# Patient Record
Sex: Male | Born: 1980 | Race: White | Hispanic: No | Marital: Married | Smoking: Current every day smoker
Health system: Southern US, Community
[De-identification: ages and names within clinical notes are randomized; demographics above are authoritative.]

## PROBLEM LIST (undated history)

## (undated) DIAGNOSIS — K900 Celiac disease: Secondary | ICD-10-CM

## (undated) DIAGNOSIS — M549 Dorsalgia, unspecified: Secondary | ICD-10-CM

## (undated) DIAGNOSIS — K219 Gastro-esophageal reflux disease without esophagitis: Secondary | ICD-10-CM

## (undated) DIAGNOSIS — G8929 Other chronic pain: Secondary | ICD-10-CM

## (undated) DIAGNOSIS — K589 Irritable bowel syndrome without diarrhea: Secondary | ICD-10-CM

## (undated) DIAGNOSIS — I10 Essential (primary) hypertension: Secondary | ICD-10-CM

## (undated) HISTORY — PX: APPENDECTOMY: SHX54

---

## 2002-03-12 ENCOUNTER — Emergency Department (HOSPITAL_COMMUNITY): Admission: EM | Admit: 2002-03-12 | Discharge: 2002-03-12 | Payer: Self-pay | Admitting: Emergency Medicine

## 2002-03-12 ENCOUNTER — Encounter: Payer: Self-pay | Admitting: Emergency Medicine

## 2002-03-13 ENCOUNTER — Emergency Department (HOSPITAL_COMMUNITY): Admission: EM | Admit: 2002-03-13 | Discharge: 2002-03-13 | Payer: Self-pay | Admitting: Emergency Medicine

## 2011-08-14 ENCOUNTER — Encounter (HOSPITAL_COMMUNITY): Payer: Self-pay | Admitting: Emergency Medicine

## 2011-08-14 ENCOUNTER — Emergency Department (HOSPITAL_COMMUNITY)
Admission: EM | Admit: 2011-08-14 | Discharge: 2011-08-14 | Disposition: A | Discharge status: Home / Self Care | Payer: BC Managed Care – PPO | Attending: Emergency Medicine | Admitting: Emergency Medicine

## 2011-08-14 DIAGNOSIS — I10 Essential (primary) hypertension: Secondary | ICD-10-CM | POA: Insufficient documentation

## 2011-08-14 DIAGNOSIS — K219 Gastro-esophageal reflux disease without esophagitis: Secondary | ICD-10-CM | POA: Insufficient documentation

## 2011-08-14 DIAGNOSIS — T7840XA Allergy, unspecified, initial encounter: Secondary | ICD-10-CM | POA: Insufficient documentation

## 2011-08-14 DIAGNOSIS — R1031 Right lower quadrant pain: Secondary | ICD-10-CM | POA: Insufficient documentation

## 2011-08-14 DIAGNOSIS — F17200 Nicotine dependence, unspecified, uncomplicated: Secondary | ICD-10-CM | POA: Insufficient documentation

## 2011-08-14 DIAGNOSIS — R21 Rash and other nonspecific skin eruption: Secondary | ICD-10-CM | POA: Insufficient documentation

## 2011-08-14 HISTORY — DX: Essential (primary) hypertension: I10

## 2011-08-14 HISTORY — DX: Gastro-esophageal reflux disease without esophagitis: K21.9

## 2011-08-14 HISTORY — DX: Other chronic pain: G89.29

## 2011-08-14 HISTORY — DX: Dorsalgia, unspecified: M54.9

## 2011-08-14 LAB — CBC
HCT: 47.1 % (ref 39.0–52.0)
Platelets: 257 10*3/uL (ref 150–400)
RBC: 5.54 MIL/uL (ref 4.22–5.81)
RDW: 13.5 % (ref 11.5–15.5)
WBC: 9.5 10*3/uL (ref 4.0–10.5)

## 2011-08-14 LAB — COMPREHENSIVE METABOLIC PANEL
ALT: 56 U/L — ABNORMAL HIGH (ref 0–53)
AST: 27 U/L (ref 0–37)
Alkaline Phosphatase: 101 U/L (ref 39–117)
CO2: 24 mEq/L (ref 19–32)
Chloride: 100 mEq/L (ref 96–112)
GFR calc non Af Amer: >90 mL/min (ref >90)
Sodium: 138 mEq/L (ref 135–145)
Total Bilirubin: 0.9 mg/dL (ref 0.3–1.2)

## 2011-08-14 LAB — URINALYSIS, ROUTINE W REFLEX MICROSCOPIC
Bilirubin Urine: NEGATIVE
Glucose, UA: NEGATIVE mg/dL
Hgb urine dipstick: NEGATIVE
Ketones, ur: NEGATIVE mg/dL
Leukocytes, UA: NEGATIVE
Nitrite: NEGATIVE
Protein, ur: NEGATIVE mg/dL
Specific Gravity, Urine: 1.012 (ref 1.005–1.030)
Urobilinogen, UA: 0.2 mg/dL (ref 0.0–1.0)
pH: 6.5 (ref 5.0–8.0)

## 2011-08-14 LAB — DIFFERENTIAL
Basophils Absolute: 0 10*3/uL (ref 0.0–0.1)
Lymphocytes Relative: 30 % (ref 12–46)
Lymphs Abs: 2.8 10*3/uL (ref 0.7–4.0)
Neutro Abs: 5.1 10*3/uL (ref 1.7–7.7)

## 2011-08-14 MED ORDER — PREDNISONE 10 MG PO TABS: 40.0000 mg | ORAL_TABLET | Freq: Every day | ORAL | Status: DC

## 2011-08-14 MED ORDER — CETIRIZINE-PSEUDOEPHEDRINE ER 5-120 MG PO TB12: 1.0000 | ORAL_TABLET | Freq: Every day | ORAL | Status: DC

## 2011-08-14 MED ORDER — FENTANYL CITRATE 0.05 MG/ML IJ SOLN: 50.0000 ug | Freq: Once | INTRAMUSCULAR | Status: DC

## 2011-08-14 NOTE — Discharge Instructions (Signed)
Allergic Reaction, Mild to Moderate °Allergies may happen from anything your body is sensitive to. This may be food, medications, pollens, chemicals, and nearly anything around you in everyday life that produces allergens. An allergen is anything that causes an allergy producing substance. Allergens cause your body to release allergic antibodies. Through a chain of events, they cause a release of histamine into the blood stream. Histamines are meant to protect you, but they also cause your discomfort. This is why antihistamines are often used for allergies. Heredity is often a factor in causing allergic reactions. This means you may have some of the same allergies as your parents. °Allergies happen in all age groups. You may have some idea of what caused your reaction. There are many allergens around us. It may be difficult to know what caused your reaction. If this is a first time event, it may never happen again. Allergies cannot be cured but can be controlled with medications. °SYMPTOMS  °You may get some or all of the following problems from allergies. °· Swelling and itching in and around the mouth.  °· Tearing, itchy eyes.  °· Nasal congestion and runny nose.  °· Sneezing and coughing.  °· An itchy red rash or hives.  °· Vomiting or diarrhea.  °· Difficulty breathing.  °Seasonal allergies occur in all age groups. They are seasonal because they usually occur during the same season every year. They may be a reaction to molds, grass pollens, or tree pollens. Other causes of allergies are house dust mite allergens, pet dander and mold spores. These are just a common few of the thousands of allergens around us. All of the symptoms listed above happen when you come in contact with pollens and other allergens. Seasonal allergies are usually not life threatening. They are generally more of a nuisance that can often be handled using medications. °Hay fever is a combination of all or some of the above listed allergy  problems. It may often be treated with simple over-the-counter medications such as diphenhydramine. Take medication as directed. Check with your caregiver or package insert for child dosages. °TREATMENT AND HOME CARE INSTRUCTIONS °If hives or rash are present: °· Take medications as directed.  °· You may use an over-the-counter antihistamine (diphenhydramine) for hives and itching as needed. Do not drive or drink alcohol until medications used to treat the reaction have worn off. Antihistamines tend to make people sleepy.  °· Apply cold cloths (compresses) to the skin or take baths in cool water. This will help itching. Avoid hot baths or showers. Heat will make a rash and itching worse.  °· If your allergies persist and become more severe, and over the counter medications are not effective, there are many new medications your caretaker can prescribe. Immunotherapy or desensitizing injections can be used if all else fails. Follow up with your caregiver if problems continue.  °SEEK MEDICAL CARE IF:  °· Your allergies are becoming progressively more troublesome.  °· You suspect a food allergy. Symptoms generally happen within 30 minutes of eating a food.  °· Your symptoms have not gone away within 2 days or are getting worse.  °· You develop new symptoms.  °· You want to retest yourself or your child with a food or drink you think causes an allergic reaction. Never test yourself or your child of a suspected allergy without being under the watchful eye of your caregivers. A second exposure to an allergen may be life-threatening.  °SEEK IMMEDIATE MEDICAL CARE IF: °· You   develop difficulty breathing or wheezing, or have a tight feeling in your chest or throat.  °· You develop a swollen mouth, hives, swelling, or itching all over your body.  °A severe reaction with any of the above problems should be considered life-threatening. If you suddenly develop difficulty breathing call for local emergency medical help. THIS IS AN  EMERGENCY. °MAKE SURE YOU:  °· Understand these instructions.  °· Will watch your condition.  °· Will get help right away if you are not doing well or get worse.  °Document Released: 02/02/2007 Document Revised: 03/27/2011 Document Reviewed: 02/02/2007 °ExitCare® Patient Information ©2012 [employer]. ° ° ° ° °Epinephrine Injection °Epinephrine is a medicine given by injection to temporarily treat an emergency allergic reaction. It is also used to treat severe asthmatic attacks and other lung problems. The medicine helps to enlarge (dilate) the small breathing tubes of the lungs. A life-threatening, sudden allergic reaction that involves the whole body is called anaphylaxis. Because of potential side effects, epinephrine should only be used as directed by your caregiver. °RISKS AND COMPLICATIONS °Possible side effects of epinephrine injections include: °· Chest pain.  °· Irregular or rapid heartbeat.  °· Shortness of breath.  °· Nausea.  °· Vomiting.  °· Abdominal pain or cramping.  °· Sweating.  °· Dizziness.  °· Weakness.  °· Headache.  °· Nervousness.  °Report all side effects to your caregiver. °HOW TO GIVE AN EPINEPHRINE INJECTION °Give the epinephrine injection immediately when symptoms of a severe reaction begin. Inject the medicine into the outer thigh or any available, large muscle. Your caregiver can teach you how to do this. You do not need to remove any clothing. After the injection, call your local emergency services (911 in [place] Even if you improve after the injection, you need to be examined at a hospital emergency department. Epinephrine works quickly, but it also wears off quickly. Delayed reactions can occur. A delayed reaction may be as serious and dangerous as the initial reaction. °HOME CARE INSTRUCTIONS °· Make sure you and your family know how to give an epinephrine injection.  °· Use epinephrine injections as directed by your caregiver. Do not use this medicine more often or in larger  doses than prescribed.  °· Always carry your epinephrine injection or anaphylaxis kit with you. This can be lifesaving if you have a severe reaction.  °· Store the medicine in a cool, dry place. If the medicine becomes discolored or cloudy, dispose of it properly and replace it with new medicine.  °· Check the expiration date on your medicine. It may be unsafe to use medicines past their expiration date.  °· Tell your caregiver about any other medicines you are taking. Some medicines can react badly with epinephrine.  °· Tell your caregiver about any medical conditions you have, such as diabetes, high blood pressure (hypertension), heart disease, irregular heartbeats, or if you are pregnant.  °SEEK IMMEDIATE MEDICAL CARE IF: °· You have used an epinephrine injection. Call your local emergency services (911 in [place] Even if you improve after the injection, you need to be examined at a hospital emergency department to make sure your allergic reaction is under control. You will also be monitored for adverse effects from the medicine.  °· You have chest pain.  °· You have irregular or fast heartbeats.  °· You have shortness of breath.  °· You have severe headaches.  °· You have severe nausea, vomiting, or abdominal cramps.  °· You have severe pain, swelling, or   redness in the area where you gave the injection.  °Document Released: 04/04/2000 Document Revised: 03/27/2011 Document Reviewed: 12/25/2010 °ExitCare® Patient Information ©2012 [employer]. °

## 2011-08-14 NOTE — ED Provider Notes (Signed)
History     CSN: [id number]  Arrival date & time 08/14/11  1501   First MD Initiated Contact with Patient 08/14/11 1706      Chief Complaint  Patient presents with  . Abdominal Pain  . Rash     HPI Per pt, has rash that started ago, reports having poison ivy, but reports its worse than normal a/w SOB, pt does have rash to arms and abdomen; also, reports was d/'c from [facility] 2 weeks ago; infection in lower intestine and appendix was swollen 9mm per pt for 2 days; pt reports pain in RLQ, and on each side of ribs; pt describes pain as burning and reports its the same as when he went into hospital before; pt reports diarrhea, denies n/v, denies fevers/chills, but reports feeling flushed in face; denies taking medicine at home; pt resp e/u, pt able to have conversation without difficulty; pt in NAD; reports went last week to [facility] for same sx- drew blood work and gave meds.  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  Patient does admit to being peanut butter about an hour prior to the onset of these symptoms.  Past Medical History  Diagnosis Date  . Hypertension   . GERD (gastroesophageal reflux disease)   . Chronic back pain     History reviewed. No pertinent past surgical history.  No family history on file.  History  Substance Use Topics  . Smoking status: Current Everyday Smoker -- 1.0 packs/day  . Smokeless tobacco: Not on file  . Alcohol Use: No      Review of Systems All other review of systems negative Allergies  Loperamide  Home Medications   Current Outpatient Rx  Name Route Sig Dispense Refill  . CITALOPRAM HYDROBROMIDE 20 MG PO TABS Oral Take 20 mg by mouth at bedtime.    . CYCLOBENZAPRINE HCL 10 MG PO TABS Oral Take 10 mg by mouth daily as needed. As needed for muscle spasms.    Marland Kitchen HYDROCODONE-ACETAMINOPHEN 7.5-500 MG PO TABS Oral Take 1 tablet by mouth at bedtime. As  needed for pain.    Marland Kitchen CETIRIZINE-PSEUDOEPHEDRINE ER 5-120 MG PO TB12 Oral Take 1 tablet by mouth daily. 30 tablet 0  . PREDNISONE 10 MG PO TABS Oral Take 4 tablets (40 mg total) by mouth daily. 20 tablet 0    BP 151/90  Pulse 92  Temp 97.7 F (36.5 C)  Resp 18  SpO2 100%  Physical Exam  Nursing note and vitals reviewed. Constitutional: He is oriented to person, place, and time. He appears well-developed and well-nourished. No distress.  HENT:  Head: Normocephalic and atraumatic.  Eyes: Pupils are equal, round, and reactive to light.  Neck: Normal range of motion.  Cardiovascular: Normal rate and intact distal pulses.   Pulmonary/Chest: No respiratory distress.  Abdominal: Normal appearance. He exhibits no distension. There is no tenderness. There is no rebound and no guarding.  Musculoskeletal: Normal range of motion.  Neurological: He is alert and oriented to person, place, and time. No cranial nerve deficit.  Skin: Skin is warm and dry. Rash noted.       Rash on both upper extremities consistent with a contact dermatitis.  This rash however is not what brought him to the emergency department.  He states that that was a dry rash all over his body especially his face and had the feeling of swelling to his throat.  Psychiatric: He has a normal mood and affect. His behavior is normal.    ED Course  Procedures (including critical care time)  Labs Reviewed  DIFFERENTIAL - Abnormal; Notable for the following:    Eosinophils Relative 7 (*)    All other components within normal limits  COMPREHENSIVE METABOLIC PANEL - Abnormal; Notable for the following:    Potassium 3.4 (*)    Glucose, Bld 111 (*)    ALT 56 (*)    All other components within normal limits  URINALYSIS, ROUTINE W REFLEX MICROSCOPIC  CBC   No results found.   1. Allergic reaction       MDM   The story this patient presents most likely is consistent with a allergic reaction or anaphylactic hyperactivity.   The source may be food and could be peanut.  I did instruct him to avoid any peanut-containing food or foods that have been cooked in peanut oil.  Also strongly encouraged him to followup with an allergist for formal skin testing and further evaluation.  Patient is currently now back to his baseline and has no allergic rash or has no symptoms of anaphylaxis.       Nelia Shi, MD 08/14/11 [phone]

## 2011-08-14 NOTE — ED Notes (Addendum)
Per pt, has rash that started ago, reports having poison ivy, but reports its worse than normal a/w SOB, pt does have rash to arms and abdomen; also, reports was d/'c from [facility] 2 weeks ago; infection in lower intestine and appendix was swollen 9mm per pt for 2 days; pt reports pain in RLQ, and on each side of ribs; pt describes pain as burning and reports its the same as when he went into hospital before; pt reports diarrhea, denies n/v, denies fevers/chills, but reports feeling flushed in face; denies taking medicine at home; pt resp e/u, pt able to have conversation without difficulty; pt in NAD; reports went last week to [facility] for same sx- drew blood work and gave meds

## 2011-08-29 ENCOUNTER — Emergency Department (HOSPITAL_COMMUNITY)
Admission: EM | Admit: 2011-08-29 | Discharge: 2011-08-29 | Disposition: A | Discharge status: Home / Self Care | Payer: BC Managed Care – PPO | Attending: Emergency Medicine | Admitting: Emergency Medicine

## 2011-08-29 ENCOUNTER — Encounter (HOSPITAL_COMMUNITY): Payer: Self-pay | Admitting: Emergency Medicine

## 2011-08-29 ENCOUNTER — Emergency Department (HOSPITAL_COMMUNITY): Payer: BC Managed Care – PPO

## 2011-08-29 ENCOUNTER — Emergency Department (HOSPITAL_COMMUNITY)
Admission: EM | Admit: 2011-08-29 | Discharge: 2011-08-29 | Discharge status: Against Medical Advice | Payer: BC Managed Care – PPO | Attending: Emergency Medicine | Admitting: Emergency Medicine

## 2011-08-29 DIAGNOSIS — K589 Irritable bowel syndrome without diarrhea: Secondary | ICD-10-CM | POA: Insufficient documentation

## 2011-08-29 DIAGNOSIS — S335XXA Sprain of ligaments of lumbar spine, initial encounter: Secondary | ICD-10-CM | POA: Insufficient documentation

## 2011-08-29 DIAGNOSIS — S46909A Unspecified injury of unspecified muscle, fascia and tendon at shoulder and upper arm level, unspecified arm, initial encounter: Secondary | ICD-10-CM | POA: Insufficient documentation

## 2011-08-29 DIAGNOSIS — Y92410 Unspecified street and highway as the place of occurrence of the external cause: Secondary | ICD-10-CM | POA: Insufficient documentation

## 2011-08-29 DIAGNOSIS — S39012A Strain of muscle, fascia and tendon of lower back, initial encounter: Secondary | ICD-10-CM

## 2011-08-29 DIAGNOSIS — S40019A Contusion of unspecified shoulder, initial encounter: Secondary | ICD-10-CM | POA: Insufficient documentation

## 2011-08-29 DIAGNOSIS — G8929 Other chronic pain: Secondary | ICD-10-CM | POA: Insufficient documentation

## 2011-08-29 DIAGNOSIS — K900 Celiac disease: Secondary | ICD-10-CM | POA: Insufficient documentation

## 2011-08-29 DIAGNOSIS — S4990XA Unspecified injury of shoulder and upper arm, unspecified arm, initial encounter: Secondary | ICD-10-CM

## 2011-08-29 DIAGNOSIS — I10 Essential (primary) hypertension: Secondary | ICD-10-CM | POA: Insufficient documentation

## 2011-08-29 DIAGNOSIS — S4980XA Other specified injuries of shoulder and upper arm, unspecified arm, initial encounter: Secondary | ICD-10-CM | POA: Insufficient documentation

## 2011-08-29 DIAGNOSIS — K219 Gastro-esophageal reflux disease without esophagitis: Secondary | ICD-10-CM | POA: Insufficient documentation

## 2011-08-29 DIAGNOSIS — S40011A Contusion of right shoulder, initial encounter: Secondary | ICD-10-CM

## 2011-08-29 HISTORY — DX: Irritable bowel syndrome without diarrhea: K58.9

## 2011-08-29 HISTORY — DX: Celiac disease: K90.0

## 2011-08-29 NOTE — ED Notes (Signed)
Awaiting merge of charts for triage and secondary charts for documentation transfer.
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
## 2011-09-01 ENCOUNTER — Encounter (HOSPITAL_COMMUNITY): Payer: Self-pay | Admitting: Emergency Medicine

## 2011-11-11 ENCOUNTER — Emergency Department (HOSPITAL_COMMUNITY)
Admission: EM | Admit: 2011-11-11 | Discharge: 2011-11-11 | Disposition: A | Discharge status: Home / Self Care | Payer: BC Managed Care – PPO | Attending: Emergency Medicine | Admitting: Emergency Medicine

## 2011-11-11 ENCOUNTER — Emergency Department (HOSPITAL_COMMUNITY): Payer: BC Managed Care – PPO

## 2011-11-11 ENCOUNTER — Encounter (HOSPITAL_COMMUNITY): Payer: Self-pay

## 2011-11-11 DIAGNOSIS — I10 Essential (primary) hypertension: Secondary | ICD-10-CM | POA: Insufficient documentation

## 2011-11-11 DIAGNOSIS — Z79899 Other long term (current) drug therapy: Secondary | ICD-10-CM | POA: Insufficient documentation

## 2011-11-11 DIAGNOSIS — R079 Chest pain, unspecified: Secondary | ICD-10-CM | POA: Insufficient documentation

## 2011-11-11 DIAGNOSIS — R221 Localized swelling, mass and lump, neck: Secondary | ICD-10-CM | POA: Insufficient documentation

## 2011-11-11 DIAGNOSIS — R220 Localized swelling, mass and lump, head: Secondary | ICD-10-CM | POA: Insufficient documentation

## 2011-11-11 DIAGNOSIS — G8929 Other chronic pain: Secondary | ICD-10-CM | POA: Insufficient documentation

## 2011-11-11 DIAGNOSIS — Z9089 Acquired absence of other organs: Secondary | ICD-10-CM | POA: Insufficient documentation

## 2011-11-11 DIAGNOSIS — T7840XA Allergy, unspecified, initial encounter: Secondary | ICD-10-CM

## 2011-11-11 DIAGNOSIS — R0602 Shortness of breath: Secondary | ICD-10-CM | POA: Insufficient documentation

## 2011-11-11 DIAGNOSIS — K589 Irritable bowel syndrome without diarrhea: Secondary | ICD-10-CM | POA: Insufficient documentation

## 2011-11-11 LAB — CBC WITH DIFFERENTIAL/PLATELET
Basophils Absolute: 0 10*3/uL (ref 0.0–0.1)
Basophils Relative: 0 % (ref 0–1)
Eosinophils Absolute: 0.4 10*3/uL (ref 0.0–0.7)
MCH: 30.9 pg (ref 26.0–34.0)
MCHC: 36.1 g/dL — ABNORMAL HIGH (ref 30.0–36.0)
Monocytes Relative: 8 % (ref 3–12)
Neutrophils Relative %: 54 % (ref 43–77)
Platelets: 289 10*3/uL (ref 150–400)
RDW: 12.9 % (ref 11.5–15.5)

## 2011-11-11 LAB — BASIC METABOLIC PANEL
Calcium: 9.4 mg/dL (ref 8.4–10.5)
GFR calc non Af Amer: >90 mL/min (ref >90)
Glucose, Bld: 122 mg/dL — ABNORMAL HIGH (ref 70–99)
Sodium: 138 mEq/L (ref 135–145)

## 2011-11-11 LAB — TROPONIN I: Troponin I: <0.3 ng/mL (ref <0.30)

## 2011-11-11 MED ORDER — DIPHENHYDRAMINE HCL 25 MG PO TABS: 25.0000 mg | ORAL_TABLET | Freq: Four times a day (QID) | ORAL | Status: DC

## 2011-11-11 MED ORDER — SODIUM CHLORIDE 0.9 % IV SOLN: INTRAVENOUS | Status: DC

## 2011-11-11 MED ORDER — FAMOTIDINE 20 MG PO TABS: 20.0000 mg | ORAL_TABLET | Freq: Two times a day (BID) | ORAL | Status: DC

## 2011-11-11 MED ORDER — ONDANSETRON HCL 4 MG/2ML IJ SOLN
4.0000 mg | Freq: Once | INTRAMUSCULAR | Status: AC
  Administered 2011-11-11: 4 mg via INTRAVENOUS
  Filled 2011-11-11: qty 2

## 2011-11-11 MED ORDER — FAMOTIDINE IN NACL 20-0.9 MG/50ML-% IV SOLN
20.0000 mg | Freq: Once | INTRAVENOUS | Status: AC
  Administered 2011-11-11: 20 mg via INTRAVENOUS
  Filled 2011-11-11: qty 50

## 2011-11-11 MED ORDER — PREDNISONE 10 MG PO TABS: 40.0000 mg | ORAL_TABLET | Freq: Every day | ORAL | Status: DC

## 2011-11-11 MED ORDER — SODIUM CHLORIDE 0.9 % IV BOLUS (SEPSIS)
250.0000 mL | Freq: Once | INTRAVENOUS | Status: AC
  Administered 2011-11-11: 10:00:00 via INTRAVENOUS

## 2011-11-11 MED ORDER — METHYLPREDNISOLONE SODIUM SUCC 125 MG IJ SOLR
125.0000 mg | Freq: Once | INTRAMUSCULAR | Status: AC
  Administered 2011-11-11: 125 mg via INTRAVENOUS
  Filled 2011-11-11: qty 2

## 2011-11-11 MED ORDER — DIPHENHYDRAMINE HCL 50 MG/ML IJ SOLN
25.0000 mg | Freq: Once | INTRAMUSCULAR | Status: AC
  Administered 2011-11-11: 25 mg via INTRAVENOUS
  Filled 2011-11-11: qty 1
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
## 2011-11-11 NOTE — ED Notes (Signed)
Pt c/o chest pain, sob and states he feels like he is swelling all over. Pt denies any bee sting.

## 2011-11-11 NOTE — ED Provider Notes (Signed)
History  This chart was scribed for Jerome Jakes, MD by Erskine Emery. This patient was seen in room APA04/APA04 and the patient's care was started at 9:02.   CSN: [id number]  Arrival date & time 11/11/11  4098   First MD Initiated Contact with Patient 11/11/11 [phone]      Chief Complaint  Patient presents with  . Allergic Reaction    (Consider location/radiation/quality/duration/timing/severity/associated sxs/prior treatment) HPI Jerome Anderson is a 31 y.o. male who presents to the Emergency Department complaining of facial swelling, throat swelling, right chest pain, abdominal pain, SOB, some erythema of the chest and face, and welts on the skin just PTA, with a sudden onset while using the bathroom about 15 minutes ago. Pt denies any tongue or lip swelling, headache, fever, sore throat, nausea, vomiting, diarrhea, cough, or dysuria but states he feels hot. Pt reports previous episodes of similar symptoms, the last of which was about 1.5 months ago. After this episode the pt had an appendectomy laparoscopically at [facility]. Pt also reports back pain that is chronic and admits to sun exposure in the areas the rash is most erythematous.  Pt's PCP [facility] [place]  Past Medical History  Diagnosis Date  . Hypertension   . Chronic back pain   . Celiac disease   . Irritable bowel syndrome (IBS)   . GERD (gastroesophageal reflux disease)     Past Surgical History  Procedure Date  . Appendectomy     No family history on file.  History  Substance Use Topics  . Smoking status: Current Everyday Smoker -- 1.0 packs/day for 10 years    Types: Cigarettes  . Smokeless tobacco: Not on file  . Alcohol Use: No      Review of Systems  Constitutional: Negative for fever and chills.       Hot  HENT: Positive for sore throat (swollen), facial swelling and trouble swallowing.        No tongue or lip swelling  Respiratory: Positive for shortness of  breath. Negative for cough.   Cardiovascular: Positive for chest pain (right-sided).  Gastrointestinal: Positive for abdominal pain. Negative for nausea, vomiting and diarrhea.  Genitourinary: Negative for dysuria.  Skin: Positive for rash.  Neurological: Negative for syncope, weakness and headaches.  All other systems reviewed and are negative.   A complete 10 system review of systems was obtained and all systems are negative except as noted in the HPI and PMH.    Allergies  Bee venom; Imodium a-d; and Loperamide  Home Medications   Current Outpatient Rx  Name Route Sig Dispense Refill  . ALBUTEROL SULFATE HFA 108 (90 BASE) MCG/ACT IN AERS Inhalation Inhale 2 puffs into the lungs every 6 (six) hours as needed. Shortness of Breath    . BUDESONIDE-FORMOTEROL FUMARATE 80-4.5 MCG/ACT IN AERO Inhalation Inhale 2 puffs into the lungs once a week.    Marland Kitchen CITALOPRAM HYDROBROMIDE 20 MG PO TABS Oral Take 20 mg by mouth daily.    . CYCLOBENZAPRINE HCL 10 MG PO TABS Oral Take 10 mg by mouth daily. Spasms    . HYDROCODONE-ACETAMINOPHEN 7.5-500 MG PO TABS Oral Take 1 tablet by mouth at bedtime. As needed for pain.    Marland Kitchen OMEPRAZOLE 40 MG PO CPDR Oral Take 40 mg by mouth daily.    . SUMATRIPTAN SUCCINATE 25 MG PO TABS Oral Take 25 mg by mouth every 2 (two) hours as needed. Migraine    . DIPHENHYDRAMINE HCL 25 MG  PO TABS Oral Take 1 tablet (25 mg total) by mouth every 6 (six) hours. 20 tablet 0  . FAMOTIDINE 20 MG PO TABS Oral Take 1 tablet (20 mg total) by mouth 2 (two) times daily. 14 tablet 0  . PREDNISONE 10 MG PO TABS Oral Take 4 tablets (40 mg total) by mouth daily. 20 tablet 0    BP 121/73  Pulse 91  Temp 97.6 F (36.4 C) (Oral)  Resp 18  Ht 6\' 1"  (1.854 m)  Wt 190 lb (86.183 kg)  BMI 25.07 kg/m2  SpO2 95%  Physical Exam  Nursing note and vitals reviewed. Constitutional: He is oriented to person, place, and time. He appears well-developed and well-nourished. No distress.  HENT:    Head: Normocephalic and atraumatic.       Mucus membranes are a little dry. No tongue swelling.   Eyes: EOM are normal. Pupils are equal, round, and reactive to light.  Neck: Neck supple. No tracheal deviation present.  Cardiovascular: Normal rate, regular rhythm and normal heart sounds.   No murmur heard. Pulmonary/Chest: Effort normal and breath sounds normal. No respiratory distress. He has no wheezes.  Abdominal: Soft. Bowel sounds are normal. He exhibits no distension. There is no tenderness.       .   Musculoskeletal: Normal range of motion. He exhibits no edema.  Lymphadenopathy:    He has no cervical adenopathy.  Neurological: He is alert and oriented to person, place, and time. No cranial nerve deficit. Coordination normal.  Skin: Skin is warm and dry.       Erythematous rash, not raised much but does blanch, all over the abdomen and extremities  Psychiatric: He has a normal mood and affect. His behavior is normal.    ED Course  Procedures (including critical care time)  DIAGNOSTIC STUDIES: Oxygen Saturation is 96% on room air, adequate by my interpretation.    COORDINATION OF CARE: 9:15--I evaluated the patient and we discussed a treatment plan including a chest x-ray and medication for the rash to which the pt agreed.   9:30--Medication orders: sodium chloride 0.9% bolus 250 mL--once   Ondansetron (Zofran) injection 4 mg--once   Methylprednisolone sodium succinate (Solu-medrol) 125 mg/2 mL injection 125 mg--once   Diphenhydramine (Benadryl) injection 25 mg--once   Famotidine (Pepcid) IVPB 20 mg--once  11:07--I rechecked the pt, he is resting well and is still 96% on room air. I informed him that I would be discharging him soon and that he should take Benadryl for another 2 days and Pepcid for another week.   Labs Reviewed  CBC WITH DIFFERENTIAL - Abnormal; Notable for the following:    MCHC 36.1 (*)     All other components within normal limits  BASIC METABOLIC  PANEL - Abnormal; Notable for the following:    Glucose, Bld 122 (*)     All other components within normal limits  D-DIMER, QUANTITATIVE  TROPONIN I   Dg Chest Port 1 View  11/11/2011  *RADIOLOGY REPORT*  Clinical Data: Allergic reaction, smoker  PORTABLE CHEST - 1 VIEW  Comparison: Portable exam 0932 hours without priors for comparison.  Findings: Normal heart size, mediastinal contours, and pulmonary vascularity. Peribronchial thickening with minimal atelectasis at left base. No infiltrate, pleural effusion or pneumothorax. No acute osseous findings.  IMPRESSION: Minimal bronchitic changes and left basilar atelectasis.  Original Report Authenticated By: Lollie Marrow, M.D.     Date: 11/11/2011  Rate: 115  Rhythm: sinus tachycardia  QRS Axis: normal  Intervals: normal  ST/T Wave abnormalities: normal  Conduction Disutrbances:none  Narrative Interpretation:   Old EKG Reviewed: none available   1. Allergic reaction       MDM  All symptoms resolved in the emergency Department shortness of breath resolved the erythematous rash resolved with Benadryl site Medrol and Pepcid specific etiology is not clear patient has had this happen before. As well as his workup for the chest pain which was right-sided and down low that also resolved with treatments as d-dimer was nonelevated not consistent with a pulmonary embolism chest x-ray showed no acute findings of pneumonia or pneumothorax and his troponin was negative and his EKG had no acute changes.    I personally performed the services described in this documentation, which was scribed in my presence. The recorded information has been reviewed and considered.         Jerome Jakes, MD 11/11/11 1146

## 2012-08-04 ENCOUNTER — Emergency Department: Payer: Self-pay | Admitting: Unknown Physician Specialty

## 2012-08-04 LAB — CBC
HCT: 49.7 %
HGB: 17 g/dL
MCH: 29.5 pg
MCHC: 34.2 g/dL
MCV: 86 fL
Platelet: 373 x10 3/mm 3
RBC: 5.76 x10 6/mm 3
RDW: 13.5 %
WBC: 19.6 x10 3/mm 3 — ABNORMAL HIGH

## 2012-08-04 LAB — COMPREHENSIVE METABOLIC PANEL
Alkaline Phosphatase: 72 U/L (ref 50–136)
Anion Gap: 9 (ref 7–16)
BUN: 17 mg/dL (ref 7–18)
Bilirubin,Total: 1 mg/dL (ref 0.2–1.0)
Chloride: 104 mmol/L (ref 98–107)
Creatinine: 0.93 mg/dL (ref 0.60–1.30)
EGFR (Non-African Amer.): >60
Potassium: 3.6 mmol/L (ref 3.5–5.1)
SGOT(AST): 12 U/L — ABNORMAL LOW (ref 15–37)
Sodium: 136 mmol/L (ref 136–145)
Total Protein: 7.2 g/dL (ref 6.4–8.2)

## 2013-04-22 ENCOUNTER — Encounter (HOSPITAL_COMMUNITY): Payer: Self-pay | Admitting: Emergency Medicine

## 2013-04-22 ENCOUNTER — Emergency Department (HOSPITAL_COMMUNITY)
Admission: EM | Admit: 2013-04-22 | Discharge: 2013-04-23 | Disposition: A | Discharge status: Home / Self Care | Payer: BC Managed Care – PPO | Attending: Emergency Medicine | Admitting: Emergency Medicine

## 2013-04-22 DIAGNOSIS — K219 Gastro-esophageal reflux disease without esophagitis: Secondary | ICD-10-CM | POA: Insufficient documentation

## 2013-04-22 DIAGNOSIS — Z79899 Other long term (current) drug therapy: Secondary | ICD-10-CM | POA: Insufficient documentation

## 2013-04-22 DIAGNOSIS — M549 Dorsalgia, unspecified: Secondary | ICD-10-CM

## 2013-04-22 DIAGNOSIS — I10 Essential (primary) hypertension: Secondary | ICD-10-CM | POA: Insufficient documentation

## 2013-04-22 DIAGNOSIS — F17200 Nicotine dependence, unspecified, uncomplicated: Secondary | ICD-10-CM | POA: Insufficient documentation

## 2013-04-22 DIAGNOSIS — M545 Low back pain, unspecified: Secondary | ICD-10-CM | POA: Insufficient documentation

## 2013-04-22 DIAGNOSIS — G8929 Other chronic pain: Secondary | ICD-10-CM | POA: Insufficient documentation

## 2013-04-22 NOTE — ED Notes (Signed)
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

## 2013-04-23 MED ORDER — PREDNISONE 20 MG PO TABS: 40.0000 mg | ORAL_TABLET | Freq: Every day | ORAL | Status: AC

## 2013-04-23 MED ORDER — OXYCODONE-ACETAMINOPHEN 5-325 MG PO TABS: 2.0000 | ORAL_TABLET | ORAL | Status: AC | PRN

## 2013-04-23 MED ORDER — PREDNISONE 20 MG PO TABS
40.0000 mg | ORAL_TABLET | Freq: Once | ORAL | Status: AC
  Administered 2013-04-23: 40 mg via ORAL
  Filled 2013-04-23: qty 2

## 2013-04-23 MED ORDER — OXYCODONE-ACETAMINOPHEN 5-325 MG PO TABS
2.0000 | ORAL_TABLET | Freq: Once | ORAL | Status: AC
  Administered 2013-04-23: 2 via ORAL
  Filled 2013-04-23: qty 2

## 2013-04-23 NOTE — ED Notes (Signed)
Pt has a ride home.  

## 2013-04-23 NOTE — ED Provider Notes (Signed)
CSN: [id number]     Arrival date & time 04/22/13  2333 History   First MD Initiated Contact with Patient 04/22/13 2347     Chief Complaint  Patient presents with  . Back Pain   (Consider location/radiation/quality/duration/timing/severity/associated sxs/prior Treatment) HPI Comments: Patient is a 33 year old male with a 2 year history of back pain and "two protruding discs" who presents with gradual onset of lower back pain that started this evening. The pain is throbbing and severe and radiates to his right leg. The pain is constant. Movement makes the pain worse. Nothing makes the pain better. Patient has tried OTC medication for pain without relief. No associated symptoms. No saddles paresthesias or bladder/bowel incontinence. Patient reports seeing a Neurosurgeon who ordered an MRI in the past which showed "protruding discs." Patient reports having a follow up appointment with this Neurosurgeon.     Patient is a 33 y.o. male presenting with back pain.  Back Pain Associated symptoms: no abdominal pain, no chest pain, no dysuria, no fever and no weakness     Past Medical History  Diagnosis Date  . Hypertension   . Chronic back pain   . Celiac disease   . Irritable bowel syndrome (IBS)   . GERD (gastroesophageal reflux disease)    Past Surgical History  Procedure Laterality Date  . Appendectomy     No family history on file. History  Substance Use Topics  . Smoking status: Current Every Day Smoker -- 1.00 packs/day for 10 years    Types: Cigarettes  . Smokeless tobacco: Not on file  . Alcohol Use: No    Review of Systems  Constitutional: Negative for fever, chills and fatigue.  HENT: Negative for trouble swallowing.   Eyes: Negative for visual disturbance.  Respiratory: Negative for shortness of breath.   Cardiovascular: Negative for chest pain and palpitations.  Gastrointestinal: Negative for nausea, vomiting, abdominal pain and diarrhea.  Genitourinary: Negative for  dysuria and difficulty urinating.  Musculoskeletal: Positive for back pain. Negative for arthralgias and neck pain.  Skin: Negative for color change.  Neurological: Negative for dizziness and weakness.  Psychiatric/Behavioral: Negative for dysphoric mood.    Allergies  Bee venom; Meat extract; Wheat bran; and Imodium a-d  Home Medications   Current Outpatient Rx  Name  Route  Sig  Dispense  Refill  . albuterol (PROVENTIL HFA;VENTOLIN HFA) 108 (90 BASE) MCG/ACT inhaler   Inhalation   Inhale 2 puffs into the lungs every 6 (six) hours as needed for wheezing or shortness of breath. Shortness of Breath         . ALPRAZolam (XANAX) 0.25 MG tablet   Oral   Take 0.25 mg by mouth 2 (two) times daily as needed for anxiety.         Marland Kitchen. atorvastatin (LIPITOR) 40 MG tablet   Oral   Take 40 mg by mouth every evening.         . cholecalciferol (VITAMIN D) 1000 UNITS tablet   Oral   Take 2,000 Units by mouth every morning.         . cyclobenzaprine (FLEXERIL) 10 MG tablet   Oral   Take 10 mg by mouth 2 (two) times daily as needed for muscle spasms. Spasms         . omeprazole (PRILOSEC) 40 MG capsule   Oral   Take 40 mg by mouth every morning.          . ondansetron (ZOFRAN) 4 MG tablet  Oral   Take 4 mg by mouth every 8 (eight) hours as needed for nausea or vomiting.         . promethazine (PHENERGAN) 25 MG tablet   Oral   Take 25 mg by mouth every 6 (six) hours as needed for nausea or vomiting.         . ranitidine (ZANTAC) 150 MG tablet   Oral   Take 150 mg by mouth daily as needed for heartburn.         . saccharomyces boulardii (FLORASTOR) 250 MG capsule   Oral   Take 250 mg by mouth every evening.         . SUMAtriptan (IMITREX) 100 MG tablet   Oral   Take 100 mg by mouth every 2 (two) hours as needed for migraine or headache. May repeat in 2 hours if headache persists or recurs.         . traZODone (DESYREL) 50 MG tablet   Oral   Take 50 mg by  mouth at bedtime.         Marland Kitchen EPINEPHrine (EPI-PEN) 0.3 mg/0.3 mL SOAJ injection   Intramuscular   Inject 0.3 mg into the muscle once as needed (anaphylaxis).          BP 124/66  Pulse 76  Temp(Src) 98 F (36.7 C)  Resp 20  SpO2 100% Physical Exam  Nursing note and vitals reviewed. Constitutional: He is oriented to person, place, and time. He appears well-developed and well-nourished. No distress.  HENT:  Head: Normocephalic and atraumatic.  Eyes: Conjunctivae and EOM are normal.  Neck: Normal range of motion.  Cardiovascular: Normal rate and regular rhythm.  Exam reveals no gallop and no friction rub.   No murmur heard. Pulmonary/Chest: Effort normal and breath sounds normal. He has no wheezes. He has no rales. He exhibits no tenderness.  Abdominal: Soft. He exhibits no distension. There is no tenderness. There is no rebound and no guarding.  Musculoskeletal: Normal range of motion.  L4-L5 tenderness to palpation. No other midline spine tenderness to palpation or step off noted.   Neurological: He is alert and oriented to person, place, and time.  Lower extremity sensation equal and intact. Right leg strength slightly diminished due to exacerbation of back pain with movement. Speech is goal-oriented. Moves limbs without ataxia.   Skin: Skin is warm and dry.  Psychiatric: He has a normal mood and affect. His behavior is normal.    ED Course  Procedures (including critical care time) Labs Review Labs Reviewed - No data to display Imaging Review No results found.  EKG Interpretation   None       MDM   1. Back pain     12:24 AM Patient will have Percocet and prednisone for exacerbation of chronic back pain. No bladder/bowel incontinence or saddle paresthesias. Patient denies any injury. Patient instructed to follow up with his Neurosurgeon.     Emilia Beck, PA-C 04/23/13 0031

## 2013-04-23 NOTE — ED Provider Notes (Signed)
Medical screening examination/treatment/procedure(s) were performed by non-physician practitioner and as supervising physician I was immediately available for consultation/collaboration.  EKG Interpretation   None         Loys Hoselton M Rolando Hessling, MD 04/23/13 0500 

## 2013-04-23 NOTE — Discharge Instructions (Signed)
Take Prednisone as directed until gone. Take Percocet as needed for pain. Follow up with your Neurosurgeon for further evaluation and management. Refer to attached documents for more information.

## 2013-06-01 ENCOUNTER — Emergency Department: Payer: Self-pay | Admitting: Emergency Medicine

## 2013-09-16 ENCOUNTER — Emergency Department: Payer: Self-pay | Admitting: Emergency Medicine

## 2014-03-24 IMAGING — CR DG CHEST 1V PORT
1 series · 1 of 1 positions shown · non-contrast
Comparison: none

REASON FOR EXAM: sob
COMMENTS:

PROCEDURE:     DXR - DXR PORTABLE CHEST SINGLE VIEW  - August 04, 2012  [DATE]
RESULT:     The lungs are clear. The cardiac silhouette and visualized bony
skeleton are unremarkable.

[ap]
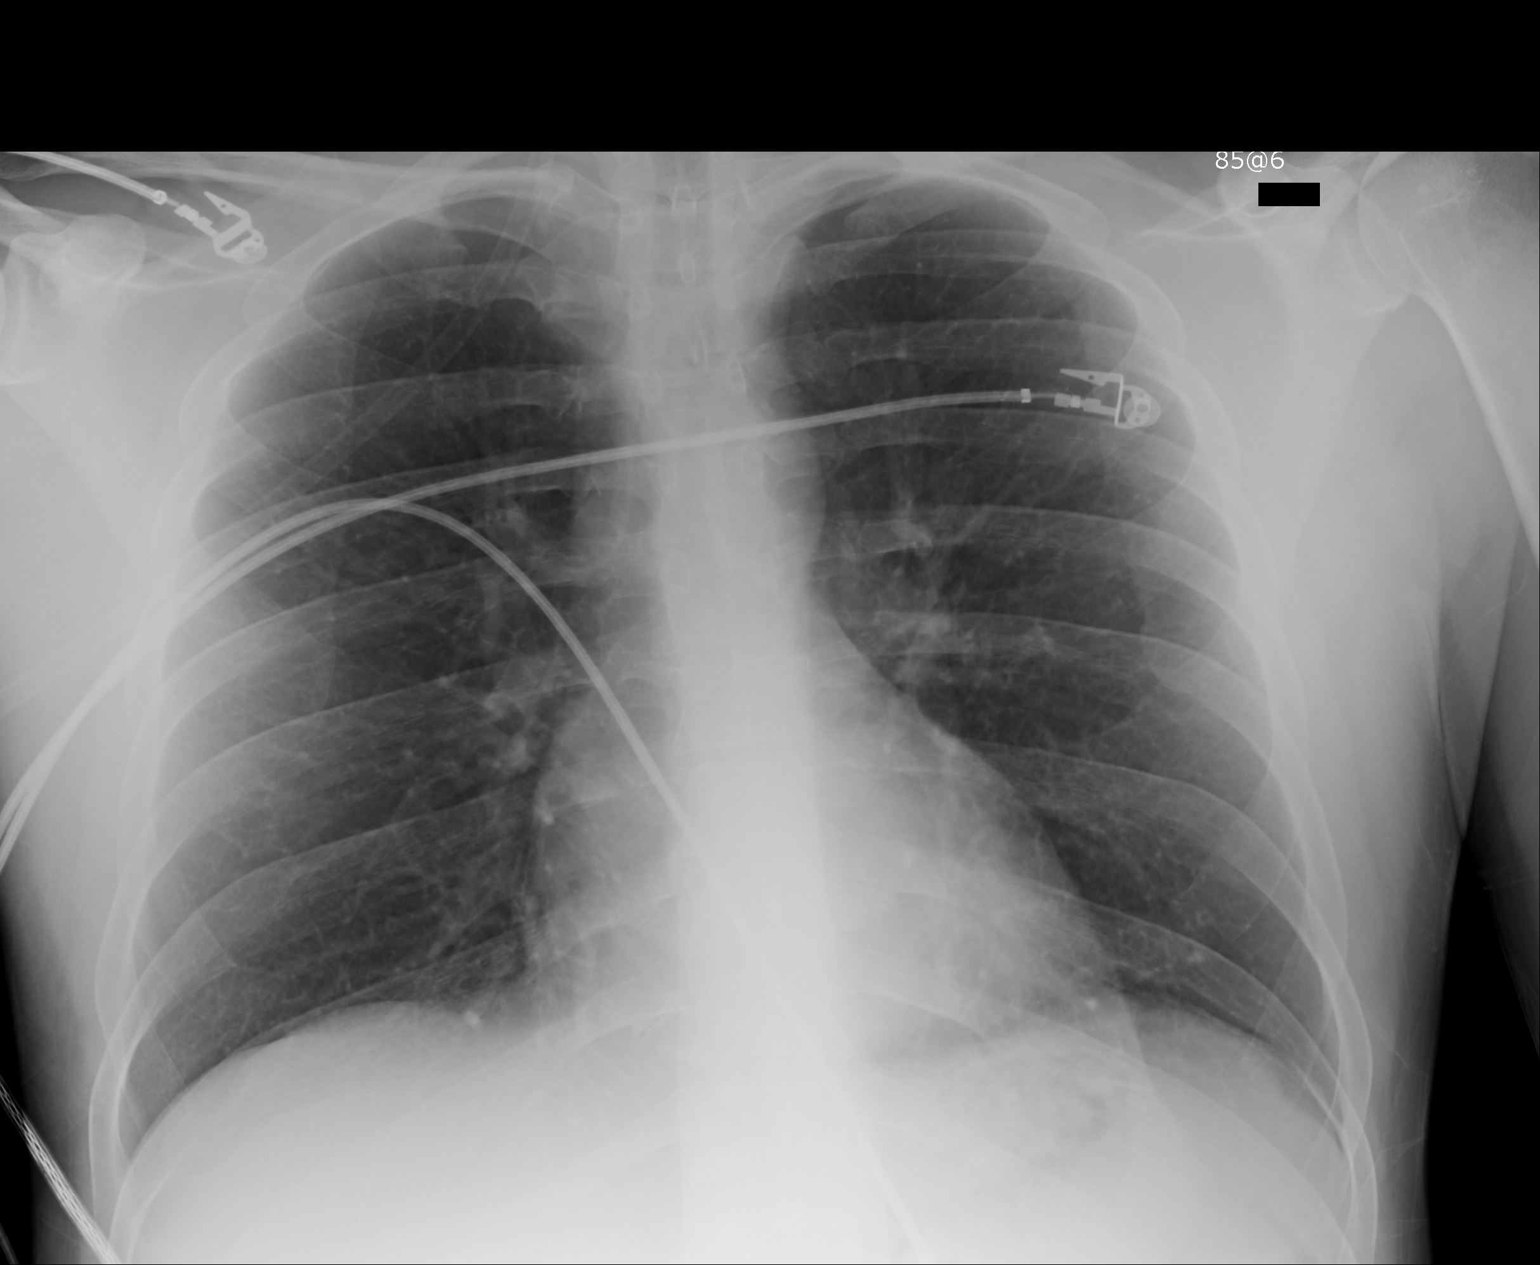

[1 of 1 positions shown; findings below may reference images not displayed]

IMPRESSION: 1. Chest radiograph without evidence of acute cardiopulmonary disease.

## 2024-05-09 ENCOUNTER — Other Ambulatory Visit (HOSPITAL_COMMUNITY): Payer: Self-pay
